# Patient Record
Sex: Female | Born: 1965 | Race: White | Hispanic: No | Marital: Single | Smoking: Former smoker
Health system: Southern US, Community
[De-identification: ages and names within clinical notes are randomized; demographics above are authoritative.]

## PROBLEM LIST (undated history)

## (undated) DIAGNOSIS — F419 Anxiety disorder, unspecified: Secondary | ICD-10-CM

## (undated) DIAGNOSIS — T7840XA Allergy, unspecified, initial encounter: Secondary | ICD-10-CM

## (undated) DIAGNOSIS — D649 Anemia, unspecified: Secondary | ICD-10-CM

## (undated) HISTORY — DX: Allergy, unspecified, initial encounter: T78.40XA

## (undated) HISTORY — DX: Anxiety disorder, unspecified: F41.9

## (undated) HISTORY — PX: TENDON REPAIR: SHX5111

## (undated) HISTORY — DX: Anemia, unspecified: D64.9

---

## 1997-06-22 ENCOUNTER — Emergency Department (HOSPITAL_COMMUNITY): Admission: EM | Admit: 1997-06-22 | Discharge: 1997-06-22 | Payer: Self-pay | Admitting: Emergency Medicine

## 2000-04-16 ENCOUNTER — Encounter: Admission: RE | Admit: 2000-04-16 | Discharge: 2000-04-16 | Payer: Self-pay | Admitting: Surgery

## 2000-04-16 ENCOUNTER — Encounter: Payer: Self-pay | Admitting: Surgery

## 2004-04-16 ENCOUNTER — Encounter: Admission: RE | Admit: 2004-04-16 | Discharge: 2004-04-16 | Payer: Self-pay | Admitting: Family Medicine

## 2005-04-27 ENCOUNTER — Encounter: Admission: RE | Admit: 2005-04-27 | Discharge: 2005-04-27 | Payer: Self-pay | Admitting: Family Medicine

## 2006-05-04 ENCOUNTER — Encounter: Admission: RE | Admit: 2006-05-04 | Discharge: 2006-05-04 | Payer: Self-pay | Admitting: Family Medicine

## 2009-04-22 ENCOUNTER — Encounter: Admission: RE | Admit: 2009-04-22 | Discharge: 2009-04-22 | Payer: Self-pay | Admitting: Family Medicine

## 2010-03-02 ENCOUNTER — Encounter: Payer: Self-pay | Admitting: Family Medicine

## 2010-04-29 ENCOUNTER — Other Ambulatory Visit: Payer: Self-pay | Admitting: Family Medicine

## 2010-04-29 DIAGNOSIS — Z1231 Encounter for screening mammogram for malignant neoplasm of breast: Secondary | ICD-10-CM

## 2010-05-05 ENCOUNTER — Ambulatory Visit
Admission: RE | Admit: 2010-05-05 | Discharge: 2010-05-05 | Disposition: A | Discharge status: Home / Self Care | Payer: BC Managed Care – PPO | Source: Ambulatory Visit | Attending: Family Medicine | Admitting: Family Medicine

## 2010-05-05 DIAGNOSIS — Z1231 Encounter for screening mammogram for malignant neoplasm of breast: Secondary | ICD-10-CM

## 2011-05-18 ENCOUNTER — Other Ambulatory Visit: Payer: Self-pay | Admitting: Family Medicine

## 2011-05-18 DIAGNOSIS — Z1231 Encounter for screening mammogram for malignant neoplasm of breast: Secondary | ICD-10-CM

## 2011-05-25 ENCOUNTER — Ambulatory Visit
Admission: RE | Admit: 2011-05-25 | Discharge: 2011-05-25 | Disposition: A | Discharge status: Home / Self Care | Payer: BC Managed Care – PPO | Source: Ambulatory Visit | Attending: Family Medicine | Admitting: Family Medicine

## 2011-05-25 DIAGNOSIS — Z1231 Encounter for screening mammogram for malignant neoplasm of breast: Secondary | ICD-10-CM

## 2012-07-11 ENCOUNTER — Other Ambulatory Visit: Payer: Self-pay

## 2012-07-11 DIAGNOSIS — Z1231 Encounter for screening mammogram for malignant neoplasm of breast: Secondary | ICD-10-CM

## 2012-08-04 ENCOUNTER — Other Ambulatory Visit: Payer: Self-pay | Admitting: Family Medicine

## 2012-08-04 DIAGNOSIS — N6320 Unspecified lump in the left breast, unspecified quadrant: Secondary | ICD-10-CM

## 2012-08-16 ENCOUNTER — Ambulatory Visit
Admission: RE | Admit: 2012-08-16 | Discharge: 2012-08-16 | Disposition: A | Discharge status: Home / Self Care | Payer: BC Managed Care – PPO | Source: Ambulatory Visit | Attending: Family Medicine | Admitting: Family Medicine

## 2012-08-16 ENCOUNTER — Ambulatory Visit: Payer: BC Managed Care – PPO

## 2012-08-16 ENCOUNTER — Other Ambulatory Visit: Payer: Self-pay | Admitting: Family Medicine

## 2012-08-16 DIAGNOSIS — N6320 Unspecified lump in the left breast, unspecified quadrant: Secondary | ICD-10-CM

## 2012-08-26 ENCOUNTER — Ambulatory Visit
Admission: RE | Admit: 2012-08-26 | Discharge: 2012-08-26 | Disposition: A | Discharge status: Home / Self Care | Payer: BC Managed Care – PPO | Source: Ambulatory Visit | Attending: Family Medicine | Admitting: Family Medicine

## 2012-08-26 ENCOUNTER — Other Ambulatory Visit: Payer: Self-pay | Admitting: Family Medicine

## 2012-08-26 DIAGNOSIS — N6320 Unspecified lump in the left breast, unspecified quadrant: Secondary | ICD-10-CM

## 2013-09-26 ENCOUNTER — Other Ambulatory Visit: Payer: Self-pay

## 2013-09-26 DIAGNOSIS — Z1231 Encounter for screening mammogram for malignant neoplasm of breast: Secondary | ICD-10-CM

## 2013-10-02 ENCOUNTER — Ambulatory Visit
Admission: RE | Admit: 2013-10-02 | Discharge: 2013-10-02 | Disposition: A | Discharge status: Home / Self Care | Payer: BC Managed Care – PPO | Source: Ambulatory Visit

## 2013-10-02 DIAGNOSIS — Z1231 Encounter for screening mammogram for malignant neoplasm of breast: Secondary | ICD-10-CM

## 2014-10-02 ENCOUNTER — Other Ambulatory Visit: Payer: Self-pay

## 2014-10-02 DIAGNOSIS — Z1231 Encounter for screening mammogram for malignant neoplasm of breast: Secondary | ICD-10-CM

## 2014-11-08 ENCOUNTER — Ambulatory Visit
Admission: RE | Admit: 2014-11-08 | Discharge: 2014-11-08 | Disposition: A | Discharge status: Home / Self Care | Payer: BLUE CROSS/BLUE SHIELD | Source: Ambulatory Visit

## 2014-11-08 DIAGNOSIS — Z1231 Encounter for screening mammogram for malignant neoplasm of breast: Secondary | ICD-10-CM

## 2015-07-15 ENCOUNTER — Encounter: Payer: Self-pay | Admitting: Family Medicine

## 2015-12-09 ENCOUNTER — Other Ambulatory Visit: Payer: Self-pay | Admitting: Family Medicine

## 2015-12-09 DIAGNOSIS — Z1231 Encounter for screening mammogram for malignant neoplasm of breast: Secondary | ICD-10-CM

## 2015-12-25 ENCOUNTER — Encounter: Payer: Self-pay | Admitting: Gastroenterology

## 2016-01-06 ENCOUNTER — Ambulatory Visit
Admission: RE | Admit: 2016-01-06 | Discharge: 2016-01-06 | Disposition: A | Discharge status: Home / Self Care | Payer: BLUE CROSS/BLUE SHIELD | Source: Ambulatory Visit | Attending: Family Medicine | Admitting: Family Medicine

## 2016-01-06 DIAGNOSIS — Z1231 Encounter for screening mammogram for malignant neoplasm of breast: Secondary | ICD-10-CM

## 2016-02-06 ENCOUNTER — Encounter: Payer: BLUE CROSS/BLUE SHIELD | Admitting: Gastroenterology

## 2016-03-20 ENCOUNTER — Ambulatory Visit: Payer: BLUE CROSS/BLUE SHIELD | Admitting: *Deleted

## 2016-03-20 DIAGNOSIS — Z1211 Encounter for screening for malignant neoplasm of colon: Secondary | ICD-10-CM

## 2016-03-20 MED ORDER — NA SULFATE-K SULFATE-MG SULF 17.5-3.13-1.6 GM/177ML PO SOLN: ORAL | 0 refills | Status: DC

## 2016-03-20 NOTE — Progress Notes (Signed)
Pt denies allergies to eggs or soy products. Denies difficulty with sedation or anesthesia. Denies any diet or weight loss medications. Denies use of supplemental oxygen.  Emmi instructions denied by patient. 

## 2016-03-23 ENCOUNTER — Encounter: Payer: Self-pay | Admitting: Gastroenterology

## 2016-04-03 ENCOUNTER — Telehealth: Payer: Self-pay | Admitting: Gastroenterology

## 2016-04-03 ENCOUNTER — Ambulatory Visit (AMBULATORY_SURGERY_CENTER): Payer: BLUE CROSS/BLUE SHIELD | Admitting: Gastroenterology

## 2016-04-03 ENCOUNTER — Encounter: Payer: Self-pay | Admitting: Gastroenterology

## 2016-04-03 VITALS — BP 110/72 | HR 66 | Temp 98.9°F | Resp 13 | Ht 64.5 in | Wt 136.0 lb

## 2016-04-03 DIAGNOSIS — Z1211 Encounter for screening for malignant neoplasm of colon: Secondary | ICD-10-CM | POA: Diagnosis not present

## 2016-04-03 DIAGNOSIS — Z1212 Encounter for screening for malignant neoplasm of rectum: Secondary | ICD-10-CM

## 2016-04-03 DIAGNOSIS — D128 Benign neoplasm of rectum: Secondary | ICD-10-CM

## 2016-04-03 DIAGNOSIS — K635 Polyp of colon: Secondary | ICD-10-CM

## 2016-04-03 DIAGNOSIS — D129 Benign neoplasm of anus and anal canal: Secondary | ICD-10-CM

## 2016-04-03 MED ORDER — SODIUM CHLORIDE 0.9 % IV SOLN: 500.0000 mL | INTRAVENOUS | Status: AC

## 2016-04-03 NOTE — Patient Instructions (Signed)
YOU HAD AN ENDOSCOPIC PROCEDURE TODAY AT [facility]:   Refer to the procedure report that was given to you for any specific questions about what was found during the examination.  If the procedure report does not answer your questions, please call your gastroenterologist to clarify.  If you requested that your care partner not be given the details of your procedure findings, then the procedure report has been included in a sealed envelope for you to review at your convenience later.  YOU SHOULD EXPECT: Some feelings of bloating in the abdomen. Passage of more gas than usual.  Walking can help get rid of the air that was put into your GI tract during the procedure and reduce the bloating. If you had a lower endoscopy (such as a colonoscopy or flexible sigmoidoscopy) you may notice spotting of blood in your stool or on the toilet paper. If you underwent a bowel prep for your procedure, you may not have a normal bowel movement for a few days.  Please Note:  You might notice some irritation and congestion in your nose or some drainage.  This is from the oxygen used during your procedure.  There is no need for concern and it should clear up in a day or so.  SYMPTOMS TO REPORT IMMEDIATELY:   Following lower endoscopy (colonoscopy or flexible sigmoidoscopy):  Excessive amounts of blood in the stool  Significant tenderness or worsening of abdominal pains  Swelling of the abdomen that is new, acute  Fever of 100F or higher   For urgent or emergent issues, a gastroenterologist can be reached at any hour by calling [phone].   DIET:  We do recommend a small meal at first, but then you may proceed to your regular diet.  Drink plenty of fluids but you should avoid alcoholic beverages for 24 hours.  ACTIVITY:  You should plan to take it easy for the rest of today and you should NOT DRIVE or use heavy machinery until tomorrow (because of the sedation medicines used during the test).     FOLLOW UP: Our staff will call the number listed on your records the next business day following your procedure to check on you and address any questions or concerns that you may have regarding the information given to you following your procedure. If we do not reach you, we will leave a message.  However, if you are feeling well and you are not experiencing any problems, there is no need to return our call.  We will assume that you have returned to your regular daily activities without incident.  If any biopsies were taken you will be contacted by phone or by letter within the next 1-3 weeks.  Please call us at [phone] if you have not heard about the biopsies in 3 weeks.   Await for biopsy results to determined repeat Colonoscopy screening in 3-10 years Polyps (handout given) Return to GI clinic as needed   SIGNATURES/CONFIDENTIALITY: You and/or your care partner have signed paperwork which will be entered into your electronic medical record.  These signatures attest to the fact that that the information above on your After Visit Summary has been reviewed and is understood.  Full responsibility of the confidentiality of this discharge information lies with you and/or your care-partner.

## 2016-04-03 NOTE — Progress Notes (Signed)
Report to PACU, RN, vss, BBS= Clear.  

## 2016-04-03 NOTE — Progress Notes (Signed)
Called to room to assist during endoscopic procedure.  Patient ID and intended procedure confirmed with present staff. Received instructions for my participation in the procedure from the performing physician.  

## 2016-04-03 NOTE — Telephone Encounter (Signed)
Called patient and she stated that she had " a lot of blood in the toilet" after she went to the bathroom.  She did state that she had some clots in it.  Nothing on her panties now; just blood in toilet. Not bleeding now. Dr. Silverio Decamp aware and is going to call her.

## 2016-04-03 NOTE — Telephone Encounter (Signed)
Patient called with c/o passing blood. She noticed small clots of blood in the  Toilet bowel and the water was all stained red. She feels well. No abdominal pain or dizziness. She may be passing residual blood that was in the lumen, she had small amount of bleeding post polypectomy that was clipped x2 with good hemostasis.  I advised patient to call back if she has any further episodes of blood per rectum. Will need to do flex sig to control the bleeding.  Damaris Hippo , MD

## 2016-04-03 NOTE — Op Note (Addendum)
[facility] Patient Name: Meghan Morgan Procedure Date: 04/03/2016 8:57 AM MRN: [id number] Endoscopist: Mauri Pole , MD Age: 51 Referring MD:  Date of Birth: [birth year] Gender: Female Account #: [id number] Procedure:                Colonoscopy Indications:              Screening for colorectal malignant neoplasm, This                            is the patient's first colonoscopy Medicines:                Monitored Anesthesia Care Procedure:                Pre-Anesthesia Assessment:                           - Prior to the procedure, a History and Physical                            was performed, and patient medications and                            allergies were reviewed. The patient's tolerance of                            previous anesthesia was also reviewed. The risks                            and benefits of the procedure and the sedation                            options and risks were discussed with the patient.                            All questions were answered, and informed consent                            was obtained. Prior Anticoagulants: The patient has                            taken no previous anticoagulant or antiplatelet                            agents. ASA Grade Assessment: II - A patient with                            mild systemic disease. After reviewing the risks                            and benefits, the patient was deemed in                            satisfactory condition to undergo the procedure.  After obtaining informed consent, the colonoscope                            was passed under direct vision. Throughout the                            procedure, the patient's blood pressure, pulse, and                            oxygen saturations were monitored continuously. The                            Model PCF-H190L [id number]) scope was introduced                            through the anus and  advanced to the the terminal                            ileum, with identification of the appendiceal                            orifice and IC valve. The colonoscopy was performed                            without difficulty. The patient tolerated the                            procedure well. The quality of the bowel                            preparation was excellent. The terminal ileum,                            ileocecal valve, appendiceal orifice, and rectum                            were photographed. Scope In: 9:10:18 AM Scope Out: 9:39:24 AM Scope Withdrawal Time: 0 hours 24 minutes 26 seconds  Total Procedure Duration: 0 hours 29 minutes 6 seconds  Findings:                 The perianal and digital rectal examinations were                            normal.                           A 12 mm polyp was found in the recto-sigmoid colon.                            The polyp was sessile. The polyp was removed with a                            cold snare. The polyp was removed with a piecemeal  technique using a cold snare. Resection and                            retrieval were complete. Noted slow ooze of blood                            from the base of polypectomy site. To prevent                            bleeding after the polypectomy, two hemostatic                            clips were successfully placed (MR conditional).                            There was no bleeding at the end of the procedure.                           The exam was otherwise without abnormality. Complications:            No immediate complications. Estimated Blood Loss:     Estimated blood loss was minimal. Impression:               - One 12 mm polyp at the recto-sigmoid colon,                            removed with a cold snare and removed piecemeal                            using a cold snare. Resected and retrieved. Clips                            (MR conditional)  were placed.                           - The examination was otherwise normal. Recommendation:           - Patient has a contact number available for                            emergencies. The signs and symptoms of potential                            delayed complications were discussed with the                            patient. Return to normal activities tomorrow.                            Written discharge instructions were provided to the                            patient.                           -  Resume previous diet.                           - Continue present medications.                           - Await pathology results.                           - Repeat colonoscopy in 3-10 years for surveillance                            based on pathology results.                           - Return to GI clinic PRN. Mauri Pole, MD 04/03/2016 9:43:49 AM This report has been signed electronically.

## 2016-04-06 ENCOUNTER — Telehealth: Payer: Self-pay | Admitting: *Deleted

## 2016-04-06 NOTE — Telephone Encounter (Signed)
  Follow up Call-  Call back number 04/03/2016  Post procedure Call Back phone  # [phone]  Permission to leave phone message Yes  Some recent data might be hidden     Patient questions:  Do you have a fever, pain , or abdominal swelling? No. Pain Score  0 *  Have you tolerated food without any problems? Yes.    Have you been able to return to your normal activities? Yes.    Do you have any questions about your discharge instructions: Diet   No. Medications  No. Follow up visit  No.  Do you have questions or concerns about your Care? No.  Actions: * If pain score is 4 or above: No action needed, pain <4.

## 2016-04-10 ENCOUNTER — Encounter: Payer: Self-pay | Admitting: Gastroenterology

## 2017-01-12 ENCOUNTER — Other Ambulatory Visit: Payer: Self-pay | Admitting: Family Medicine

## 2017-01-12 DIAGNOSIS — Z1231 Encounter for screening mammogram for malignant neoplasm of breast: Secondary | ICD-10-CM

## 2017-01-13 ENCOUNTER — Ambulatory Visit
Admission: RE | Admit: 2017-01-13 | Discharge: 2017-01-13 | Disposition: A | Discharge status: Home / Self Care | Payer: BLUE CROSS/BLUE SHIELD | Source: Ambulatory Visit | Attending: Family Medicine | Admitting: Family Medicine

## 2017-01-13 DIAGNOSIS — Z1231 Encounter for screening mammogram for malignant neoplasm of breast: Secondary | ICD-10-CM

## 2018-01-03 ENCOUNTER — Other Ambulatory Visit: Payer: Self-pay | Admitting: Family Medicine

## 2018-01-03 DIAGNOSIS — Z1231 Encounter for screening mammogram for malignant neoplasm of breast: Secondary | ICD-10-CM

## 2018-02-03 ENCOUNTER — Ambulatory Visit
Admission: RE | Admit: 2018-02-03 | Discharge: 2018-02-03 | Disposition: A | Discharge status: Home / Self Care | Payer: BLUE CROSS/BLUE SHIELD | Source: Ambulatory Visit | Attending: Family Medicine | Admitting: Family Medicine

## 2018-02-03 DIAGNOSIS — Z1231 Encounter for screening mammogram for malignant neoplasm of breast: Secondary | ICD-10-CM

## 2019-09-03 IMAGING — MG DIGITAL SCREENING BILATERAL MAMMOGRAM WITH TOMO AND CAD
8 series · 9 of 24 positions shown · non-contrast
Comparison: Previous exam(s).

CLINICAL DATA: Screening.

EXAM:
DIGITAL SCREENING BILATERAL MAMMOGRAM WITH TOMO AND CAD

[R CC synth-2D]
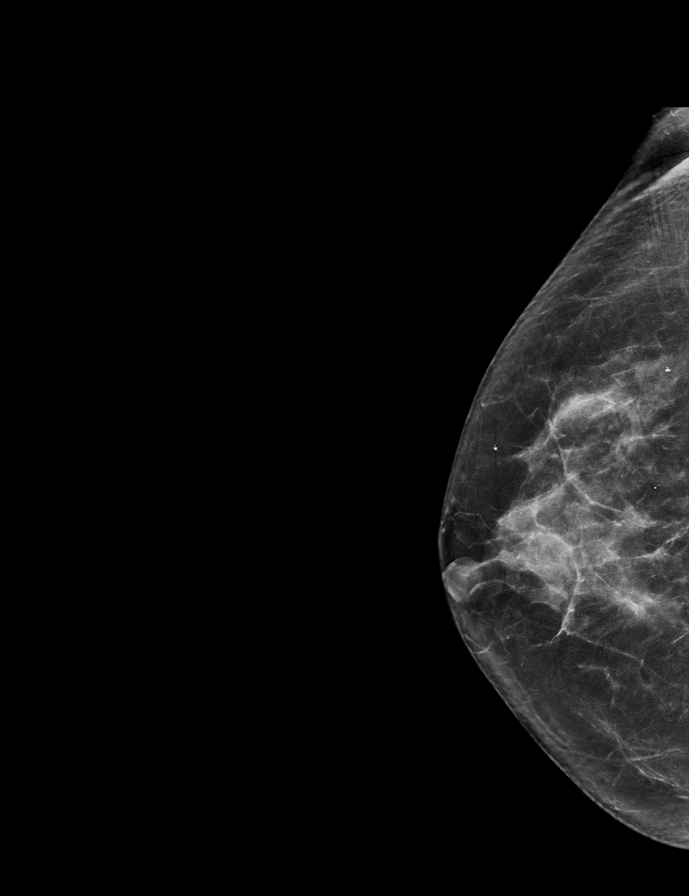

[L CC synth-2D]
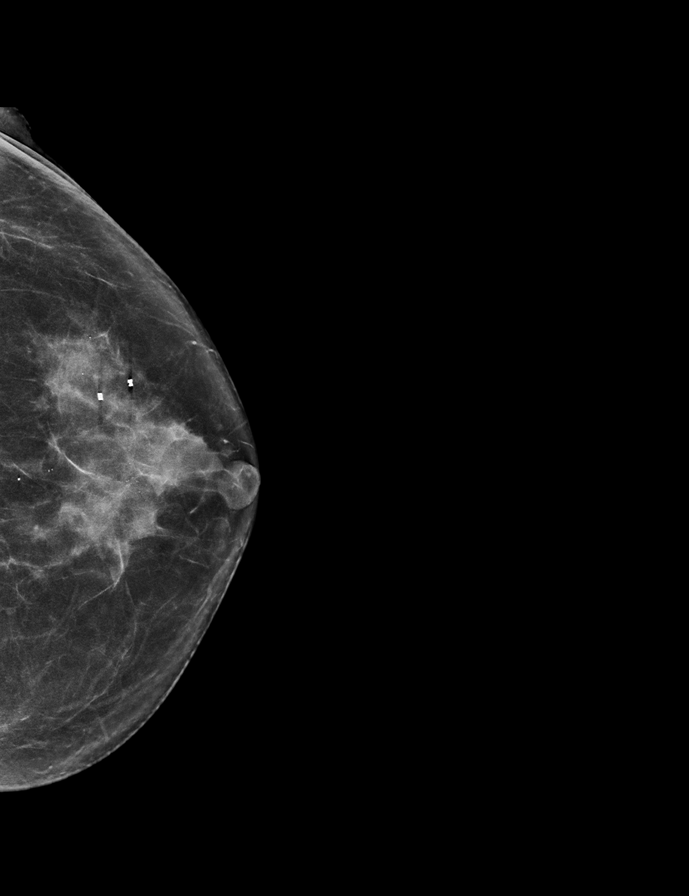

[R MLO synth-2D]
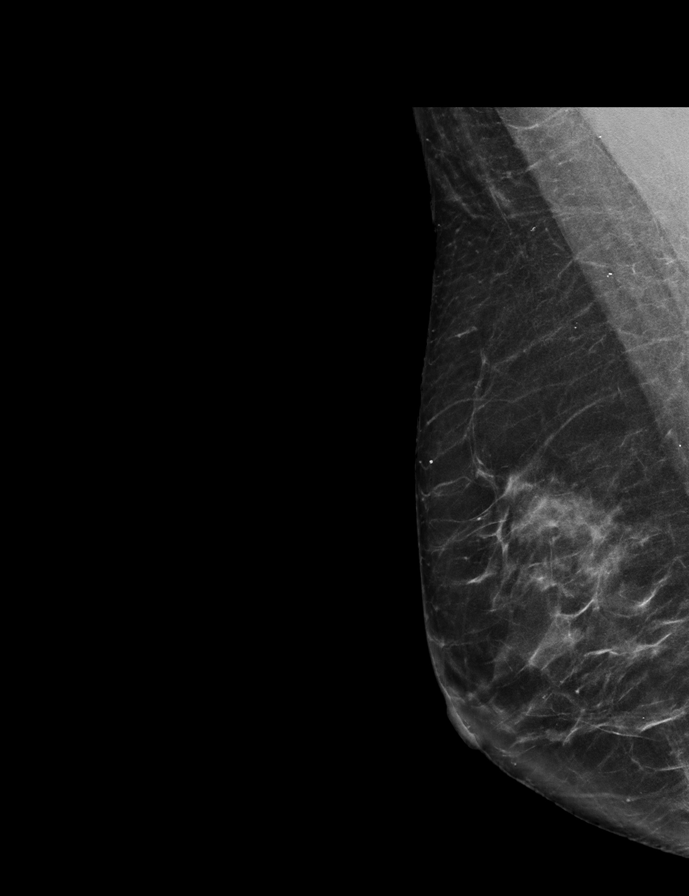

[L MLO synth-2D]
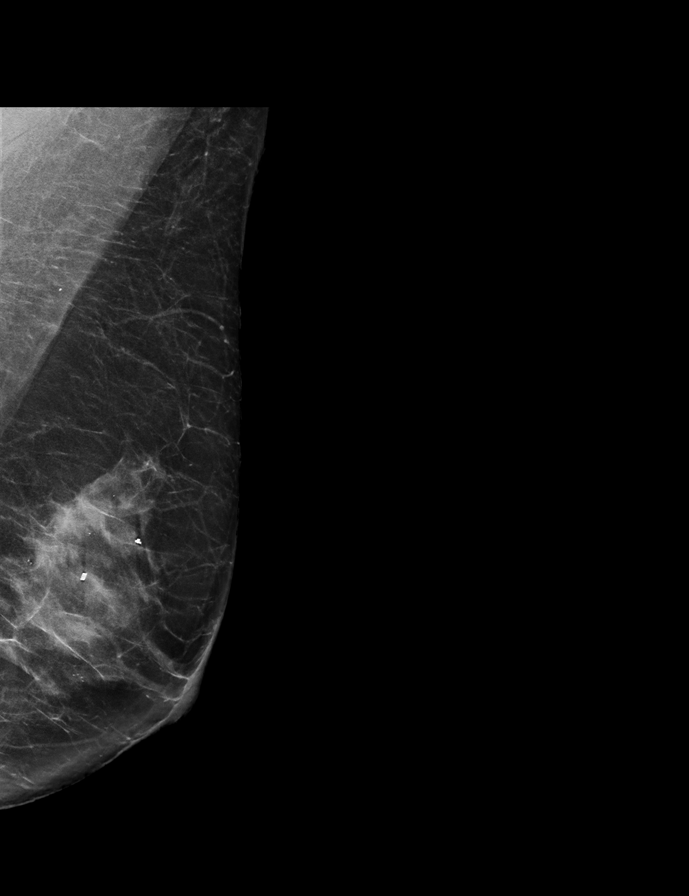

[R MLO tomo · 2 of 71 frames shown]
[frame 23/71]
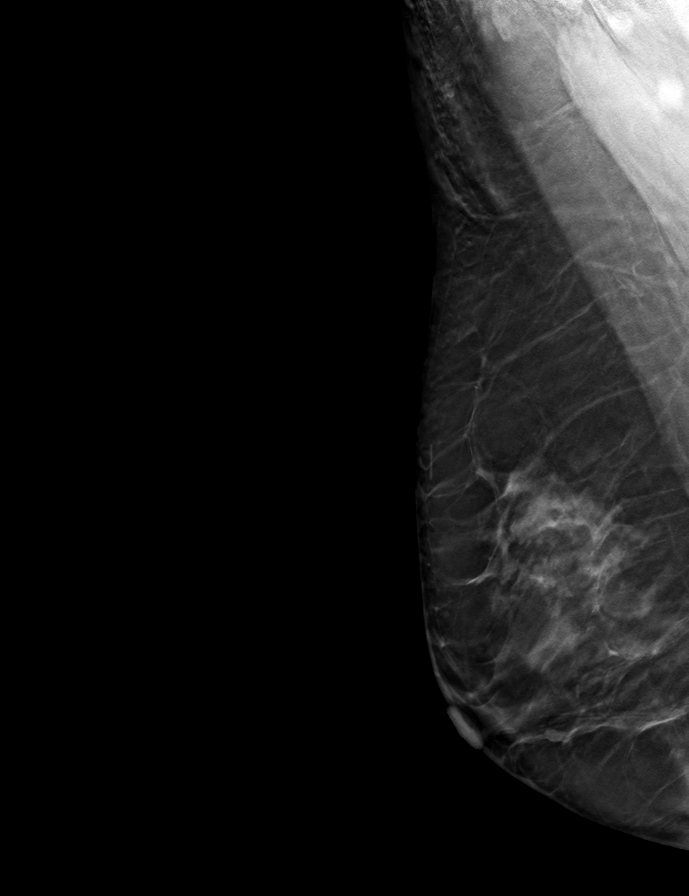
[frame 36/71]
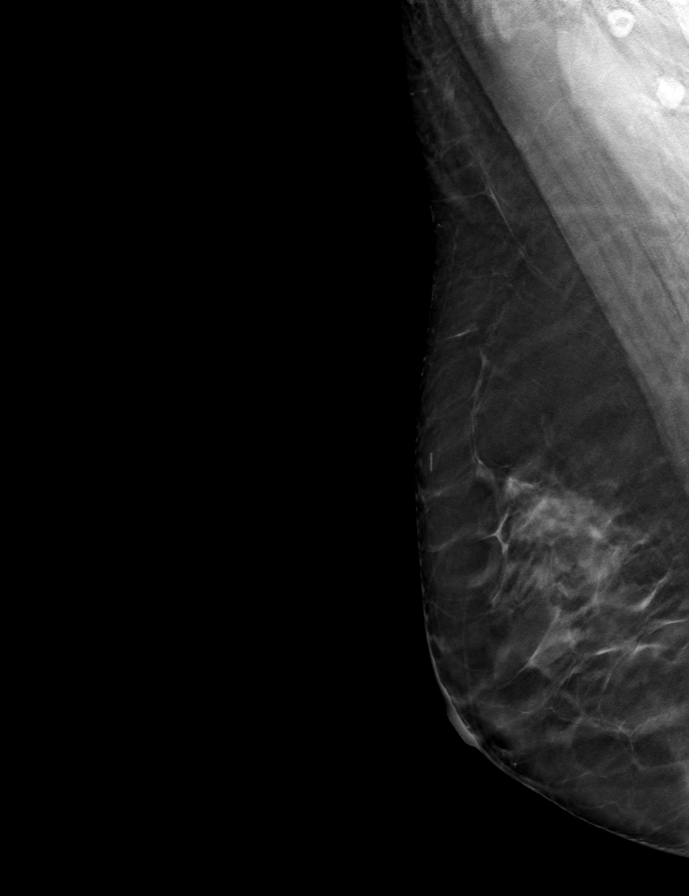

[L MLO tomo · tomo slice 34/67.0]
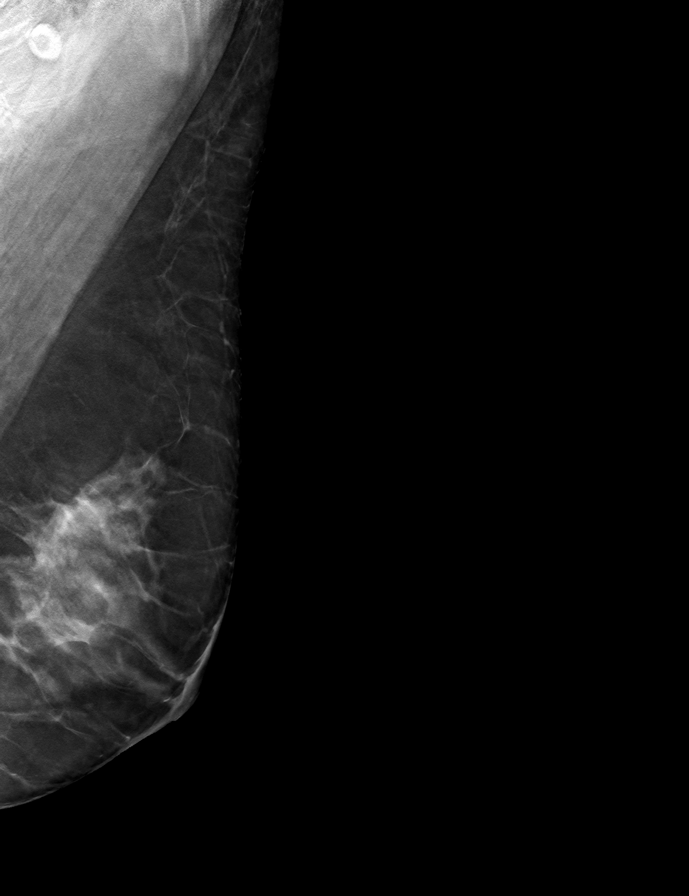

[R CC tomo · tomo slice 33/65.0]
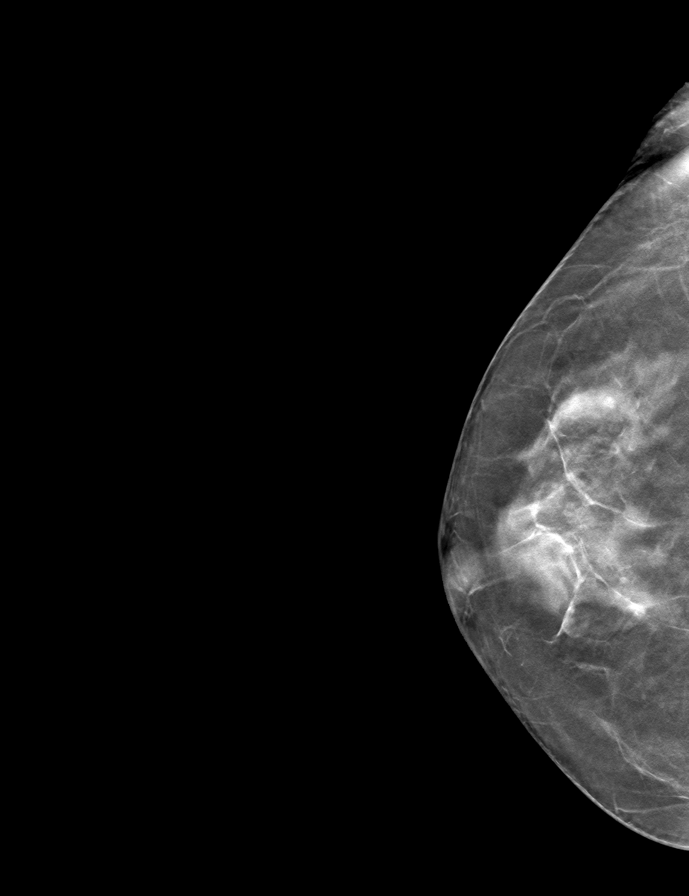

[L CC tomo · tomo slice 33/66.0]
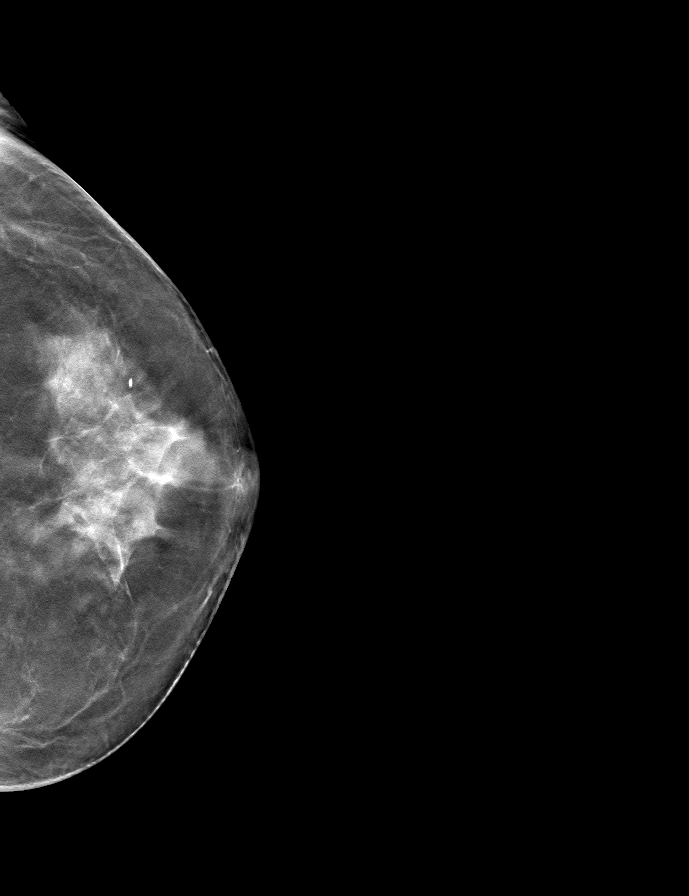

[9 of 24 positions shown; findings below may reference images not displayed]

ACR Breast Density Category c: The breast tissue is heterogeneously
dense, which may obscure small masses.
FINDINGS: There are no findings suspicious for malignancy. Images were
processed with CAD.
IMPRESSION: No mammographic evidence of malignancy. A result letter of this
screening mammogram will be mailed directly to the patient.

RECOMMENDATION:
Screening mammogram in one year. (Code:FT-U-LHB)

BI-RADS CATEGORY  1: Negative.
